# Patient Record
Sex: Female | Born: 1950 | Race: White | Hispanic: No | Marital: Married | Smoking: Never smoker
Health system: Southern US, Community
[De-identification: ages and names within clinical notes are randomized; demographics above are authoritative.]

## PROBLEM LIST (undated history)

## (undated) DIAGNOSIS — E785 Hyperlipidemia, unspecified: Secondary | ICD-10-CM

## (undated) DIAGNOSIS — Z9989 Dependence on other enabling machines and devices: Secondary | ICD-10-CM

## (undated) DIAGNOSIS — G2581 Restless legs syndrome: Secondary | ICD-10-CM

## (undated) HISTORY — PX: ABDOMINAL SURGERY: SHX537

## (undated) HISTORY — PX: BACK SURGERY: SHX140

## (undated) HISTORY — PX: CHOLECYSTECTOMY: SHX55

## (undated) HISTORY — PX: LUNG SURGERY: SHX703

---

## 2015-01-31 ENCOUNTER — Encounter (HOSPITAL_COMMUNITY): Payer: Self-pay | Admitting: Emergency Medicine

## 2015-01-31 ENCOUNTER — Emergency Department (HOSPITAL_COMMUNITY): Payer: Federal, State, Local not specified - PPO

## 2015-01-31 ENCOUNTER — Emergency Department (HOSPITAL_COMMUNITY)
Admission: EM | Admit: 2015-01-31 | Discharge: 2015-01-31 | Disposition: A | Discharge status: Home / Self Care | Payer: Federal, State, Local not specified - PPO | Attending: Emergency Medicine | Admitting: Emergency Medicine

## 2015-01-31 DIAGNOSIS — G2581 Restless legs syndrome: Secondary | ICD-10-CM | POA: Insufficient documentation

## 2015-01-31 DIAGNOSIS — Z79899 Other long term (current) drug therapy: Secondary | ICD-10-CM | POA: Diagnosis not present

## 2015-01-31 DIAGNOSIS — I10 Essential (primary) hypertension: Secondary | ICD-10-CM | POA: Diagnosis not present

## 2015-01-31 DIAGNOSIS — E785 Hyperlipidemia, unspecified: Secondary | ICD-10-CM | POA: Diagnosis not present

## 2015-01-31 DIAGNOSIS — R079 Chest pain, unspecified: Secondary | ICD-10-CM | POA: Insufficient documentation

## 2015-01-31 DIAGNOSIS — Z9981 Dependence on supplemental oxygen: Secondary | ICD-10-CM | POA: Insufficient documentation

## 2015-01-31 HISTORY — DX: Hyperlipidemia, unspecified: E78.5

## 2015-01-31 HISTORY — DX: Restless legs syndrome: G25.81

## 2015-01-31 HISTORY — DX: Dependence on other enabling machines and devices: Z99.89

## 2015-01-31 LAB — CBC WITH DIFFERENTIAL/PLATELET
BASOS ABS: 0 10*3/uL (ref 0.0–0.1)
BASOS PCT: 0 %
EOS ABS: 0.1 10*3/uL (ref 0.0–0.7)
Eosinophils Relative: 1 %
HCT: 35.9 % — ABNORMAL LOW (ref 36.0–46.0)
HEMOGLOBIN: 11.9 g/dL — AB (ref 12.0–15.0)
Lymphocytes Relative: 20 %
Lymphs Abs: 1.3 10*3/uL (ref 0.7–4.0)
MCH: 32.2 pg (ref 26.0–34.0)
MCHC: 33.1 g/dL (ref 30.0–36.0)
MCV: 97 fL (ref 78.0–100.0)
Monocytes Absolute: 0.4 10*3/uL (ref 0.1–1.0)
Monocytes Relative: 7 %
NEUTROS PCT: 71 %
Neutro Abs: 4.6 10*3/uL (ref 1.7–7.7)
Platelets: 139 10*3/uL — ABNORMAL LOW (ref 150–400)
RBC: 3.7 MIL/uL — AB (ref 3.87–5.11)
RDW: 15 % (ref 11.5–15.5)
WBC: 6.4 10*3/uL (ref 4.0–10.5)

## 2015-01-31 LAB — I-STAT TROPONIN, ED
TROPONIN I, POC: 0 ng/mL (ref 0.00–0.08)
Troponin i, poc: 0 ng/mL (ref 0.00–0.08)

## 2015-01-31 LAB — BASIC METABOLIC PANEL
Anion gap: 7 (ref 5–15)
BUN: 14 mg/dL (ref 6–20)
CHLORIDE: 109 mmol/L (ref 101–111)
CO2: 22 mmol/L (ref 22–32)
CREATININE: 0.9 mg/dL (ref 0.44–1.00)
Calcium: 8.8 mg/dL — ABNORMAL LOW (ref 8.9–10.3)
GFR calc non Af Amer: >60 mL/min (ref >60)
Glucose, Bld: 123 mg/dL — ABNORMAL HIGH (ref 65–99)
POTASSIUM: 4.6 mmol/L (ref 3.5–5.1)
SODIUM: 138 mmol/L (ref 135–145)

## 2015-01-31 MED ORDER — LORAZEPAM 1 MG PO TABS
1.0000 mg | ORAL_TABLET | Freq: Once | ORAL | Status: AC
  Administered 2015-01-31: 1 mg via ORAL
  Filled 2015-01-31: qty 1

## 2015-01-31 NOTE — Discharge Instructions (Signed)
Nonspecific Chest Pain  Follow up with her PCP or cardiologist upon returning home. Return for any chest pain, shortness of breath, or diaphoresis. Chest pain can be caused by many different conditions. There is always a chance that your pain could be related to something serious, such as a heart attack or a blood clot in your lungs. Chest pain can also be caused by conditions that are not life-threatening. If you have chest pain, it is very important to follow up with your health care provider. CAUSES  Chest pain can be caused by:  Heartburn.  Pneumonia or bronchitis.  Anxiety or stress.  Inflammation around your heart (pericarditis) or lung (pleuritis or pleurisy).  A blood clot in your lung.  A collapsed lung (pneumothorax). It can develop suddenly on its own (spontaneous pneumothorax) or from trauma to the chest.  Shingles infection (varicella-zoster virus).  Heart attack.  Damage to the bones, muscles, and cartilage that make up your chest wall. This can include:  Bruised bones due to injury.  Strained muscles or cartilage due to frequent or repeated coughing or overwork.  Fracture to one or more ribs.  Sore cartilage due to inflammation (costochondritis). RISK FACTORS  Risk factors for chest pain may include:  Activities that increase your risk for trauma or injury to your chest.  Respiratory infections or conditions that cause frequent coughing.  Medical conditions or overeating that can cause heartburn.  Heart disease or family history of heart disease.  Conditions or health behaviors that increase your risk of developing a blood clot.  Having had chicken pox (varicella zoster). SIGNS AND SYMPTOMS Chest pain can feel like:  Burning or tingling on the surface of your chest or deep in your chest.  Crushing, pressure, aching, or squeezing pain.  Dull or sharp pain that is worse when you move, cough, or take a deep breath.  Pain that is also felt in your  back, neck, shoulder, or arm, or pain that spreads to any of these areas. Your chest pain may come and go, or it may stay constant. DIAGNOSIS Lab tests or other studies may be needed to find the cause of your pain. Your health care provider may have you take a test called an ambulatory ECG (electrocardiogram). An ECG records your heartbeat patterns at the time the test is performed. You may also have other tests, such as:  Transthoracic echocardiogram (TTE). During echocardiography, sound waves are used to create a picture of all of the heart structures and to look at how blood flows through your heart.  Transesophageal echocardiogram (TEE).This is a more advanced imaging test that obtains images from inside your body. It allows your health care provider to see your heart in finer detail.  Cardiac monitoring. This allows your health care provider to monitor your heart rate and rhythm in real time.  Holter monitor. This is a portable device that records your heartbeat and can help to diagnose abnormal heartbeats. It allows your health care provider to track your heart activity for several days, if needed.  Stress tests. These can be done through exercise or by taking medicine that makes your heart beat more quickly.  Blood tests.  Imaging tests. TREATMENT  Your treatment depends on what is causing your chest pain. Treatment may include:  Medicines. These may include:  Acid blockers for heartburn.  Anti-inflammatory medicine.  Pain medicine for inflammatory conditions.  Antibiotic medicine, if an infection is present.  Medicines to dissolve blood clots.  Medicines to treat coronary  artery disease.  Supportive care for conditions that do not require medicines. This may include:  Resting.  Applying heat or cold packs to injured areas.  Limiting activities until pain decreases. HOME CARE INSTRUCTIONS  If you were prescribed an antibiotic medicine, finish it all even if you  start to feel better.  Avoid any activities that bring on chest pain.  Do not use any tobacco products, including cigarettes, chewing tobacco, or electronic cigarettes. If you need help quitting, ask your health care provider.  Do not drink alcohol.  Take medicines only as directed by your health care provider.  Keep all follow-up visits as directed by your health care provider. This is important. This includes any further testing if your chest pain does not go away.  If heartburn is the cause for your chest pain, you may be told to keep your head raised (elevated) while sleeping. This reduces the chance that acid will go from your stomach into your esophagus.  Make lifestyle changes as directed by your health care provider. These may include:  Getting regular exercise. Ask your health care provider to suggest some activities that are safe for you.  Eating a heart-healthy diet. A registered dietitian can help you to learn healthy eating options.  Maintaining a healthy weight.  Managing diabetes, if necessary.  Reducing stress. SEEK MEDICAL CARE IF:  Your chest pain does not go away after treatment.  You have a rash with blisters on your chest.  You have a fever. SEEK IMMEDIATE MEDICAL CARE IF:   Your chest pain is worse.  You have an increasing cough, or you cough up blood.  You have severe abdominal pain.  You have severe weakness.  You faint.  You have chills.  You have sudden, unexplained chest discomfort.  You have sudden, unexplained discomfort in your arms, back, neck, or jaw.  You have shortness of breath at any time.  You suddenly start to sweat, or your skin gets clammy.  You feel nauseous or you vomit.  You suddenly feel light-headed or dizzy.  Your heart begins to beat quickly, or it feels like it is skipping beats. These symptoms may represent a serious problem that is an emergency. Do not wait to see if the symptoms will go away. Get medical  help right away. Call your local emergency services (911 in the [place] Do not drive yourself to the hospital.   This information is not intended to replace advice given to you by your health care provider. Make sure you discuss any questions you have with your health care provider.   Document Released: 12/07/2004 Document Revised: 03/20/2014 Document Reviewed: 10/03/2013 Elsevier Interactive Patient Education [employer].

## 2015-01-31 NOTE — ED Provider Notes (Signed)
CSN: [id number]     Arrival date & time 01/31/15  1205 History   First MD Initiated Contact with Patient 01/31/15 1216     Chief Complaint  Patient presents with  . Chest Pain     (Consider location/radiation/quality/duration/timing/severity/associated sxs/prior Treatment) Patient is a 64 y.o. female presenting with chest pain. The history is provided by the patient. No language interpreter was used.  Chest Pain  misread sternum is a 64 year old female with a history of hypertension, hyperlipidemia, CPAP use, and restless leg syndrome who presents for left-sided chest pain radiating to the left side of her neck while sitting at the airport to go back to [place]. She was given 325 mg of aspirin and 1 nitroglycerin by EMS. She reports her pain is now 2/10. She traveled from [place] to [place] 3 days ago but states she had a stop over in [place]. She denies any use of estrogen. She denies any previous DVT or PE. Denies history of smoking cigarettes but smokes marijuana. She denies any family history. She denies any recent illness, fever, chills, shortness of breath, cough, diaphoresis, abdominal pain, nausea, vomiting, diarrhea, leg swelling.  Past Medical History  Diagnosis Date  . Restless leg   . CPAP (continuous positive airway pressure) dependence   . Hyperlipidemia    Past Surgical History  Procedure Laterality Date  . Cesarean section    . Back surgery    . Cholecystectomy    . Abdominal surgery    . Lung surgery     History reviewed. No pertinent family history. Social History  Substance Use Topics  . Smoking status: Never Smoker   . Smokeless tobacco: None  . Alcohol Use: Yes   OB History    No data available     Review of Systems  Cardiovascular: Positive for chest pain.  All other systems reviewed and are negative.     Allergies  Morphine and related and Nsaids  Home Medications   Prior to Admission medications   Medication Sig Start Date End Date  Taking? Authorizing Provider  acetaminophen (TYLENOL) 650 MG CR tablet Take 1,300 mg by mouth every 8 (eight) hours as needed for pain.   Yes Historical Provider, MD  buPROPion (WELLBUTRIN XL) 150 MG 24 hr tablet Take 150 mg by mouth daily. 12/17/14  Yes Historical Provider, MD  cholecalciferol (VITAMIN D) 1000 UNITS tablet Take 1,000 Units by mouth daily.   Yes Historical Provider, MD  gabapentin (NEURONTIN) 300 MG capsule Take 300 mg by mouth at bedtime. 01/05/15  Yes Historical Provider, MD  Melatonin 10 MG CAPS Take 10 mg by mouth at bedtime.   Yes Historical Provider, MD  Omega-3 Fatty Acids (FISH OIL) 1000 MG CAPS Take 1,000 mg by mouth daily.   Yes Historical Provider, MD  pramipexole (MIRAPEX) 1 MG tablet Take 1 mg by mouth at bedtime. 01/05/15  Yes Historical Provider, MD  sertraline (ZOLOFT) 100 MG tablet Take 100 mg by mouth daily. 01/03/15  Yes Historical Provider, MD  simvastatin (ZOCOR) 40 MG tablet Take 40 mg by mouth daily. 11/09/14  Yes Historical Provider, MD   BP 156/76 mmHg  Pulse 83  Temp(Src) 98.2 F (36.8 C) (Oral)  Resp 22  SpO2 97% Physical Exam  Constitutional: She is oriented to person, place, and time. She appears well-developed and well-nourished.  HENT:  Head: Normocephalic and atraumatic.  Eyes: Conjunctivae are normal.  Neck: Normal range of motion and full passive range of motion without pain. Neck supple. No JVD  present.  No JVD or carotid bruit.  Cardiovascular: Normal rate, regular rhythm and normal heart sounds.   Pulmonary/Chest: Effort normal and breath sounds normal.  Lungs clear to auscultation bilaterally. No decreased breath sounds, crackles, or wheezes.  Heart: Regular rate and rhythm. No murmurs.  Abdominal: Soft. She exhibits no distension. There is no tenderness.  Musculoskeletal: Normal range of motion. She exhibits no tenderness.  No tenderness or lower extremity edema.  Neurological: She is alert and oriented to person, place, and time.   Skin: Skin is warm and dry.  Nursing note and vitals reviewed.   ED Course  Procedures (including critical care time) Labs Review Labs Reviewed  CBC WITH DIFFERENTIAL/PLATELET - Abnormal; Notable for the following:    RBC 3.70 (*)    Hemoglobin 11.9 (*)    HCT 35.9 (*)    Platelets 139 (*)    All other components within normal limits  BASIC METABOLIC PANEL - Abnormal; Notable for the following:    Glucose, Bld 123 (*)    Calcium 8.8 (*)    All other components within normal limits  I-STAT TROPOININ, ED  I-STAT TROPOININ, ED    Imaging Review No results found. I have personally reviewed and evaluated these images and lab results as part of my medical decision-making.  ED ECG REPORT   Date: 01/31/2015  Rate: 70  Rhythm: normal sinus rhythm  QRS Axis: normal  Intervals: normal  ST/T Wave abnormalities: normal  Conduction Disutrbances:none  Narrative Interpretation:   Old EKG Reviewed: none available  I have personally reviewed the EKG tracing and agree with the computerized printout as noted.   MDM   Final diagnoses:  Chest pain, unspecified chest pain type   Patient presents for chest pain just prior to arrival before getting on a plane to return home. Her vital signs are stable. She was given aspirin and nitroglycerin prior to arrival. Pain has since resolved. Chest x-ray is negative for edema, infiltrate, or pneumothorax. Her labs are not concerning. Her EKG is not concerning. I do not suspect a PE even though the patient traveled recently. She states she had a stop over in [place] and had one 2 hour flight and another hour flight. I discussed these findings with the patient and explained that I would recommend that she stay overnight. She states that [place]. She is states she is also under a lot of stress because she has a daughter that is 64 y.o. with Down syndrome and has mitral valve surgery pending. She also states that her  daughter-in-law was diagnosed with breast cancer a few weeks ago and that she will be looking after her 3 grandkids starting tomorrow. I discussed that we would be getting a second troponin. If that troponin was negative, she could be discharged with return precautions. She states she has close follow-up at home and can even make a cardiology appointment if needed. Medications  LORazepam (ATIVAN) tablet 1 mg (1 mg Oral Given 01/31/15 1513)   Filed Vitals:   01/31/15 1600 01/31/15 1630  BP: 146/76 156/76  Pulse: 78 83  Temp:    Resp: 17 [address]      Veyda Kaufman Patel-Mills, PA-C 02/06/15 1027  Mirian MoMatthew Gentry, MD 02/06/15 1831

## 2015-01-31 NOTE — ED Notes (Signed)
Pt here with c/o chest pain  While sitting at the airport , pt received 324 mg asa and 1 nitro without relief

## 2016-10-31 IMAGING — CR DG CHEST 2V
2 series · 2 of 2 positions shown · non-contrast
Comparison: None.

CLINICAL DATA: Central and left-sided chest pain.

EXAM:
CHEST  2 VIEW

[chest pa]
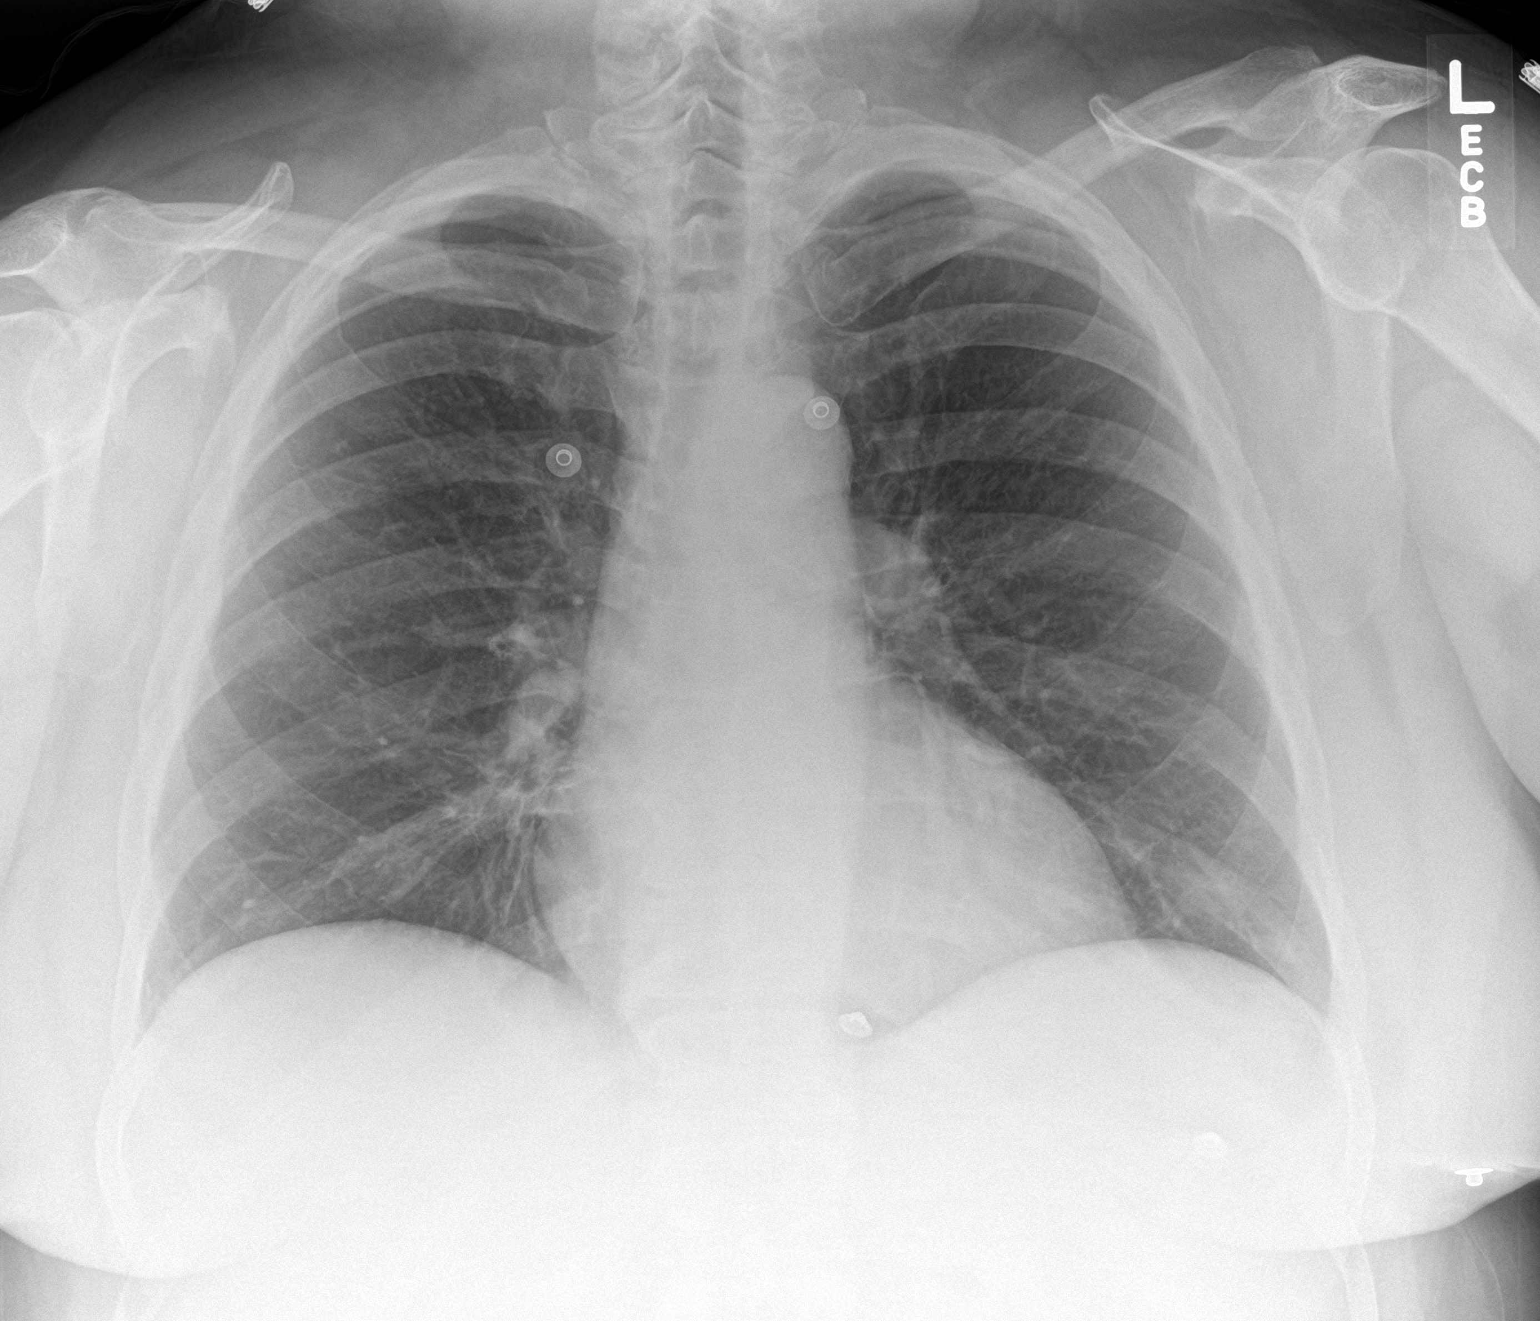

[chest lat]
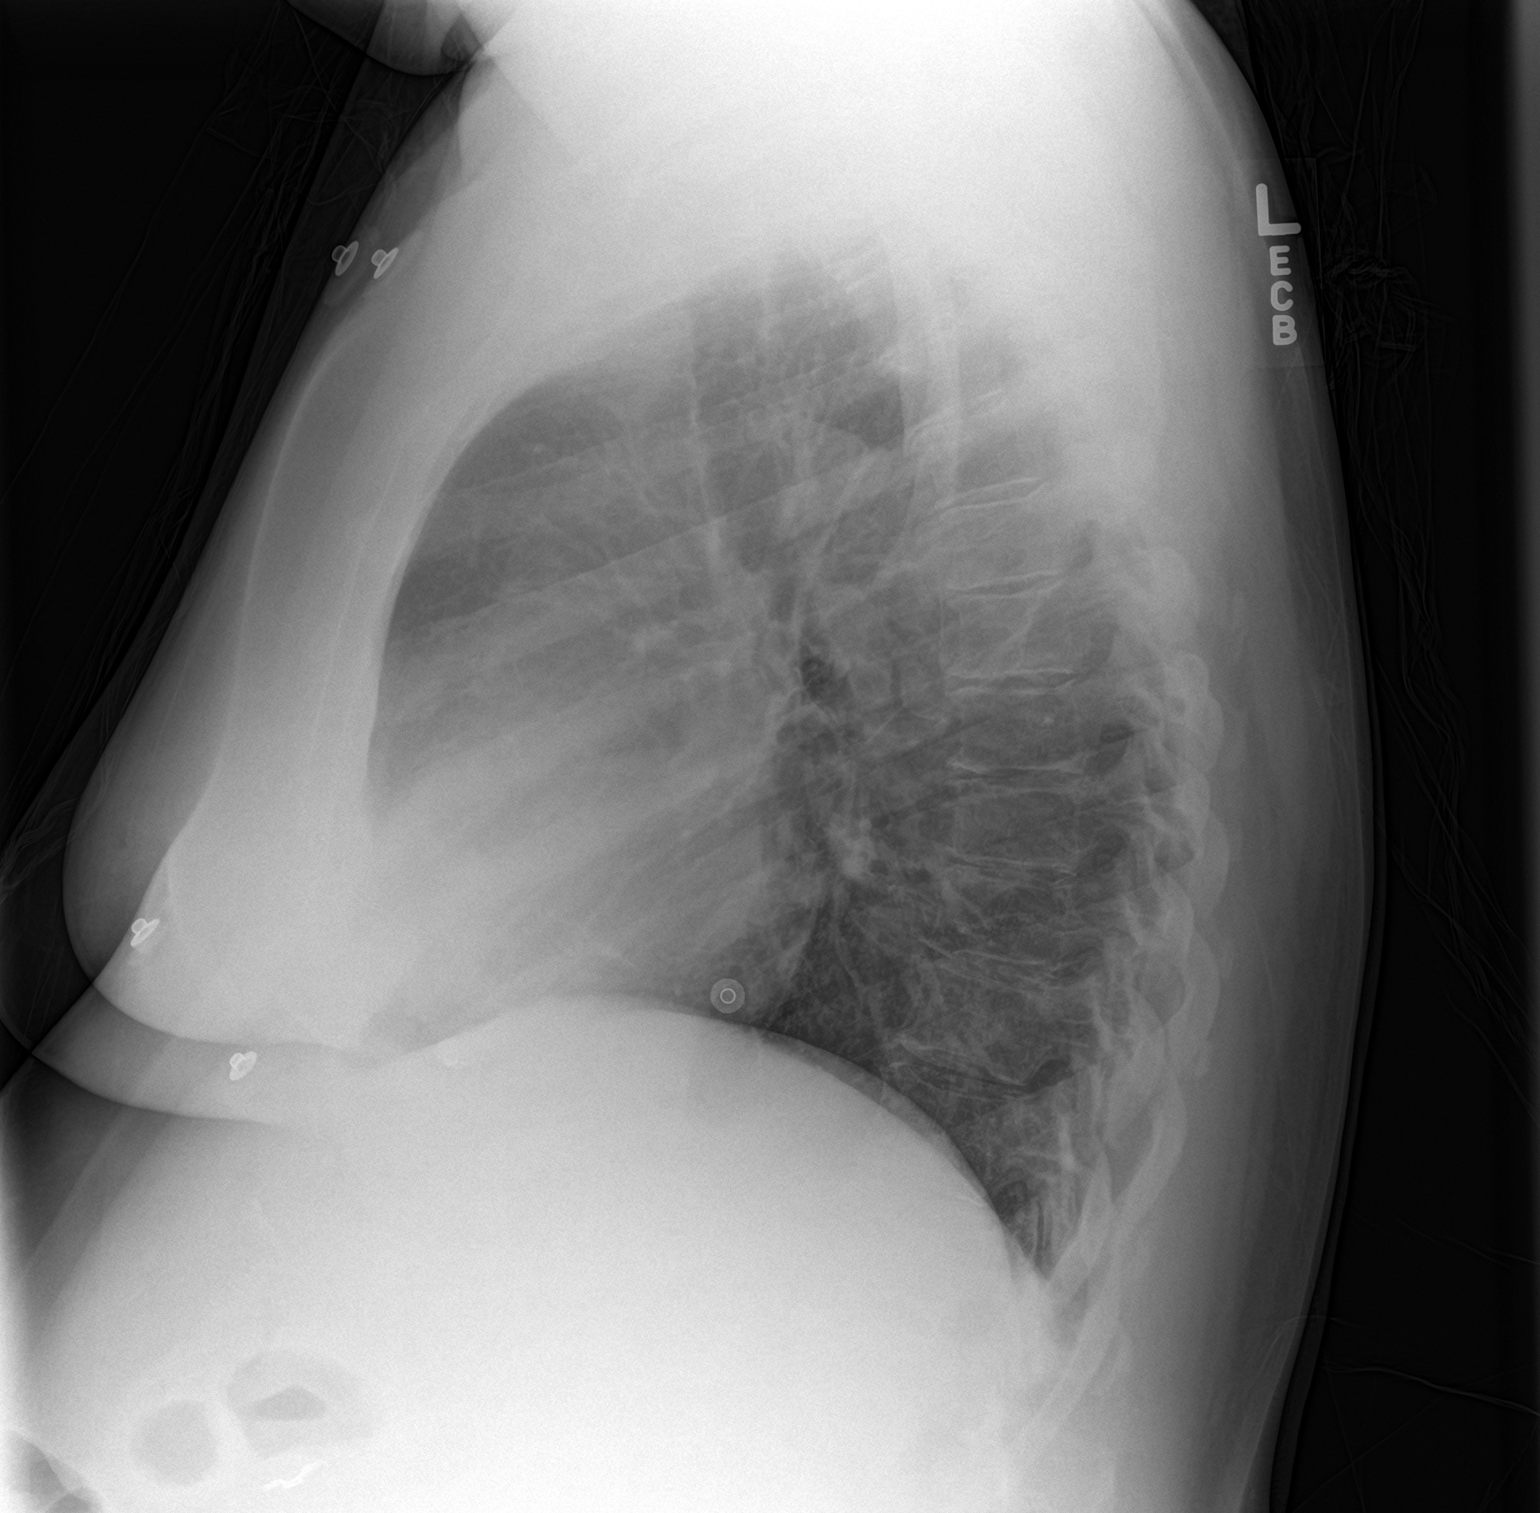

[2 of 2 positions shown; findings below may reference images not displayed]

FINDINGS: Cardiomediastinal silhouette is normal. Mediastinal contours appear
intact. There is prominence of bilateral hilar regions.

There is no evidence of focal airspace consolidation, pleural
effusion or pneumothorax.

Osseous structures are without acute abnormality. Soft tissues are
grossly normal.
IMPRESSION: No evidence of focal airspace consolidation, cardiomegaly or
pulmonary edema.

Prominence of hilar regions, which may be due to overlapping
vascular shadows. Presence of hilar lymphadenopathy cannot be
excluded however.
